# Patient Record
Sex: Female | Born: 1970 | Race: Black or African American | Hispanic: No | Marital: Single | Smoking: Former smoker
Health system: Southern US, Community
[De-identification: ages and names within clinical notes are randomized; demographics above are authoritative.]

## PROBLEM LIST (undated history)

## (undated) DIAGNOSIS — I10 Essential (primary) hypertension: Secondary | ICD-10-CM

## (undated) HISTORY — PX: HERNIA REPAIR: SHX51

## (undated) HISTORY — PX: DILITATION & CURRETTAGE/HYSTROSCOPY WITH ESSURE: SHX5573

---

## 2007-06-21 ENCOUNTER — Emergency Department (HOSPITAL_COMMUNITY): Admission: EM | Admit: 2007-06-21 | Discharge: 2007-06-21 | Payer: Self-pay | Admitting: Emergency Medicine

## 2007-06-24 ENCOUNTER — Emergency Department (HOSPITAL_COMMUNITY): Admission: EM | Admit: 2007-06-24 | Discharge: 2007-06-24 | Payer: Self-pay | Admitting: Emergency Medicine

## 2007-10-17 ENCOUNTER — Ambulatory Visit: Payer: Self-pay | Admitting: Family Medicine

## 2008-03-22 ENCOUNTER — Emergency Department (HOSPITAL_COMMUNITY): Admission: EM | Admit: 2008-03-22 | Discharge: 2008-03-22 | Payer: Self-pay | Admitting: Emergency Medicine

## 2008-11-24 ENCOUNTER — Emergency Department (HOSPITAL_COMMUNITY): Admission: EM | Admit: 2008-11-24 | Discharge: 2008-11-24 | Payer: Self-pay | Admitting: Emergency Medicine

## 2009-01-29 ENCOUNTER — Ambulatory Visit: Payer: Self-pay | Admitting: Family Medicine

## 2009-05-14 ENCOUNTER — Emergency Department (HOSPITAL_COMMUNITY): Admission: EM | Admit: 2009-05-14 | Discharge: 2009-05-14 | Payer: Self-pay | Admitting: Family Medicine

## 2009-06-29 ENCOUNTER — Emergency Department (HOSPITAL_COMMUNITY): Admission: EM | Admit: 2009-06-29 | Discharge: 2009-06-29 | Payer: Self-pay | Admitting: Family Medicine

## 2009-09-07 ENCOUNTER — Ambulatory Visit: Payer: Self-pay | Admitting: Family Medicine

## 2009-11-11 ENCOUNTER — Emergency Department (HOSPITAL_COMMUNITY): Admission: EM | Admit: 2009-11-11 | Discharge: 2009-11-11 | Payer: Self-pay | Admitting: Emergency Medicine

## 2010-07-24 ENCOUNTER — Emergency Department (HOSPITAL_COMMUNITY): Admission: EM | Admit: 2010-07-24 | Discharge: 2010-07-24 | Payer: Self-pay | Admitting: Family Medicine

## 2010-10-17 ENCOUNTER — Emergency Department (HOSPITAL_COMMUNITY): Admission: EM | Admit: 2010-10-17 | Discharge: 2010-10-17 | Payer: Self-pay | Admitting: Emergency Medicine

## 2011-03-11 LAB — GC/CHLAMYDIA PROBE AMP, GENITAL
Chlamydia, DNA Probe: NEGATIVE
GC Probe Amp, Genital: NEGATIVE

## 2011-03-11 LAB — RPR: RPR Ser Ql: NONREACTIVE

## 2011-03-11 LAB — HIV ANTIBODY (ROUTINE TESTING W REFLEX): HIV: NONREACTIVE

## 2011-03-11 LAB — WET PREP, GENITAL

## 2011-03-29 LAB — POCT URINALYSIS DIP (DEVICE)
Glucose, UA: NEGATIVE mg/dL
Nitrite: NEGATIVE
Protein, ur: NEGATIVE mg/dL
Specific Gravity, Urine: >=1.03 (ref 1.005–1.030)
Urobilinogen, UA: 0.2 mg/dL (ref 0.0–1.0)

## 2011-03-29 LAB — WET PREP, GENITAL: Yeast Wet Prep HPF POC: NONE SEEN

## 2011-03-29 LAB — HIV ANTIBODY (ROUTINE TESTING W REFLEX): HIV: NONREACTIVE

## 2011-03-29 LAB — POCT RAPID STREP A (OFFICE): Streptococcus, Group A Screen (Direct): NEGATIVE

## 2011-03-29 LAB — RPR: RPR Ser Ql: NONREACTIVE

## 2011-03-29 LAB — POCT PREGNANCY, URINE: Preg Test, Ur: NEGATIVE

## 2011-04-02 LAB — POCT URINALYSIS DIP (DEVICE)
Glucose, UA: NEGATIVE mg/dL
Nitrite: NEGATIVE
Urobilinogen, UA: 0.2 mg/dL (ref 0.0–1.0)

## 2011-04-02 LAB — GC/CHLAMYDIA PROBE AMP, GENITAL: Chlamydia, DNA Probe: NEGATIVE

## 2011-04-02 LAB — HIV ANTIBODY (ROUTINE TESTING W REFLEX): HIV: NONREACTIVE

## 2011-04-02 LAB — WET PREP, GENITAL

## 2011-06-26 ENCOUNTER — Inpatient Hospital Stay (INDEPENDENT_AMBULATORY_CARE_PROVIDER_SITE_OTHER)
Admission: RE | Admit: 2011-06-26 | Discharge: 2011-06-26 | Disposition: A | Discharge status: Home / Self Care | Payer: Medicaid Other | Source: Ambulatory Visit | Attending: Family Medicine | Admitting: Family Medicine

## 2011-06-26 DIAGNOSIS — L988 Other specified disorders of the skin and subcutaneous tissue: Secondary | ICD-10-CM

## 2011-07-21 ENCOUNTER — Emergency Department (HOSPITAL_COMMUNITY)
Admission: EM | Admit: 2011-07-21 | Discharge: 2011-07-21 | Disposition: A | Discharge status: Home / Self Care | Payer: Medicaid Other | Attending: Emergency Medicine | Admitting: Emergency Medicine

## 2011-07-21 DIAGNOSIS — R220 Localized swelling, mass and lump, head: Secondary | ICD-10-CM | POA: Insufficient documentation

## 2011-07-21 DIAGNOSIS — I10 Essential (primary) hypertension: Secondary | ICD-10-CM | POA: Insufficient documentation

## 2011-07-21 DIAGNOSIS — T7840XA Allergy, unspecified, initial encounter: Secondary | ICD-10-CM | POA: Insufficient documentation

## 2011-07-21 DIAGNOSIS — L509 Urticaria, unspecified: Secondary | ICD-10-CM | POA: Insufficient documentation

## 2011-07-21 DIAGNOSIS — H5789 Other specified disorders of eye and adnexa: Secondary | ICD-10-CM | POA: Insufficient documentation

## 2011-07-21 DIAGNOSIS — F341 Dysthymic disorder: Secondary | ICD-10-CM | POA: Insufficient documentation

## 2011-07-21 DIAGNOSIS — Z79899 Other long term (current) drug therapy: Secondary | ICD-10-CM | POA: Insufficient documentation

## 2011-10-11 LAB — I-STAT 8, (EC8 V) (CONVERTED LAB)
BUN: 11
Bicarbonate: 23.1
Glucose, Bld: 97
pCO2, Ven: 41.2 — ABNORMAL LOW
pH, Ven: 7.357 — ABNORMAL HIGH

## 2011-10-11 LAB — URINALYSIS, ROUTINE W REFLEX MICROSCOPIC
Bilirubin Urine: NEGATIVE
Hgb urine dipstick: NEGATIVE
Ketones, ur: NEGATIVE
Specific Gravity, Urine: >1.03 — ABNORMAL HIGH
pH: 6

## 2011-10-11 LAB — DIFFERENTIAL
Basophils Absolute: 0
Lymphocytes Relative: 25
Lymphs Abs: 1.9
Monocytes Absolute: 0.5
Monocytes Relative: 7
Neutro Abs: 5.2

## 2011-10-11 LAB — ETHANOL: Alcohol, Ethyl (B): <5

## 2011-10-11 LAB — RAPID URINE DRUG SCREEN, HOSP PERFORMED
Amphetamines: NOT DETECTED
Barbiturates: NOT DETECTED
Cocaine: NOT DETECTED
Opiates: NOT DETECTED

## 2011-10-11 LAB — CBC
Hemoglobin: 13.4
RBC: 4.38

## 2011-10-11 LAB — POCT PREGNANCY, URINE: Preg Test, Ur: NEGATIVE

## 2011-10-11 LAB — POCT I-STAT CREATININE
Creatinine, Ser: 0.8
Operator id: 288331

## 2012-02-19 ENCOUNTER — Encounter (HOSPITAL_COMMUNITY): Payer: Self-pay | Admitting: *Deleted

## 2012-02-19 ENCOUNTER — Emergency Department (HOSPITAL_COMMUNITY)
Admission: EM | Admit: 2012-02-19 | Discharge: 2012-02-20 | Discharge status: Against Medical Advice | Payer: Medicaid Other | Attending: Emergency Medicine | Admitting: Emergency Medicine

## 2012-02-19 ENCOUNTER — Other Ambulatory Visit: Payer: Self-pay

## 2012-02-19 DIAGNOSIS — R079 Chest pain, unspecified: Secondary | ICD-10-CM | POA: Insufficient documentation

## 2012-02-19 HISTORY — DX: Essential (primary) hypertension: I10

## 2012-02-19 LAB — COMPREHENSIVE METABOLIC PANEL
ALT: 10 U/L (ref 0–35)
AST: 16 U/L (ref 0–37)
Albumin: 3 g/dL — ABNORMAL LOW (ref 3.5–5.2)
CO2: 23 mEq/L (ref 19–32)
Calcium: 8.8 mg/dL (ref 8.4–10.5)
Creatinine, Ser: 0.76 mg/dL (ref 0.50–1.10)
GFR calc non Af Amer: >90 mL/min (ref >90)
Sodium: 138 mEq/L (ref 135–145)

## 2012-02-19 LAB — DIFFERENTIAL
Basophils Absolute: 0 10*3/uL (ref 0.0–0.1)
Lymphocytes Relative: 33 % (ref 12–46)
Monocytes Absolute: 0.6 10*3/uL (ref 0.1–1.0)
Monocytes Relative: 6 % (ref 3–12)
Neutro Abs: 5.6 10*3/uL (ref 1.7–7.7)
Neutrophils Relative %: 57 % (ref 43–77)

## 2012-02-19 LAB — CBC
HCT: 38.6 % (ref 36.0–46.0)
Hemoglobin: 13.1 g/dL (ref 12.0–15.0)
WBC: 9.8 10*3/uL (ref 4.0–10.5)

## 2012-02-19 LAB — CK TOTAL AND CKMB (NOT AT ARMC)
CK, MB: 1.7 ng/mL (ref 0.3–4.0)
Relative Index: 1.4 (ref 0.0–2.5)

## 2012-02-19 LAB — POCT I-STAT TROPONIN I

## 2012-02-19 NOTE — ED Notes (Signed)
Called for exam room, no answer, unable to find, not in w/r triage or h/w.

## 2012-02-19 NOTE — ED Notes (Signed)
Pt c/o lt sided chest pain for 3-4 days with sob dizziness and nausea.  Her pain is worse lying down.

## 2012-02-20 NOTE — ED Notes (Signed)
Pt called, unable to locate.

## 2012-08-29 ENCOUNTER — Emergency Department (HOSPITAL_COMMUNITY)
Admission: EM | Admit: 2012-08-29 | Discharge: 2012-08-29 | Disposition: A | Discharge status: Home / Self Care | Payer: Medicaid Other | Source: Home / Self Care | Attending: Emergency Medicine | Admitting: Emergency Medicine

## 2012-08-29 ENCOUNTER — Encounter (HOSPITAL_COMMUNITY): Payer: Self-pay | Admitting: *Deleted

## 2012-08-29 DIAGNOSIS — G8929 Other chronic pain: Secondary | ICD-10-CM

## 2012-08-29 DIAGNOSIS — Z202 Contact with and (suspected) exposure to infections with a predominantly sexual mode of transmission: Secondary | ICD-10-CM

## 2012-08-29 DIAGNOSIS — K089 Disorder of teeth and supporting structures, unspecified: Secondary | ICD-10-CM

## 2012-08-29 DIAGNOSIS — Z2089 Contact with and (suspected) exposure to other communicable diseases: Secondary | ICD-10-CM

## 2012-08-29 LAB — WET PREP, GENITAL
Trich, Wet Prep: NONE SEEN
Yeast Wet Prep HPF POC: NONE SEEN

## 2012-08-29 MED ORDER — ACETAMINOPHEN-CODEINE #3 300-30 MG PO TABS: 1.0000 | ORAL_TABLET | Freq: Four times a day (QID) | ORAL | Status: AC | PRN

## 2012-08-29 MED ORDER — LIDOCAINE HCL (PF) 1 % IJ SOLN
INTRAMUSCULAR | Status: AC
  Filled 2012-08-29: qty 5

## 2012-08-29 MED ORDER — AZITHROMYCIN 250 MG PO TABS
ORAL_TABLET | ORAL | Status: AC
  Filled 2012-08-29: qty 4

## 2012-08-29 MED ORDER — CEFTRIAXONE SODIUM 250 MG IJ SOLR
250.0000 mg | Freq: Once | INTRAMUSCULAR | Status: AC
  Administered 2012-08-29: 250 mg via INTRAMUSCULAR

## 2012-08-29 MED ORDER — CEFTRIAXONE SODIUM 250 MG IJ SOLR
INTRAMUSCULAR | Status: AC
  Filled 2012-08-29: qty 250

## 2012-08-29 MED ORDER — METRONIDAZOLE 500 MG PO TABS: 500.0000 mg | ORAL_TABLET | Freq: Two times a day (BID) | ORAL | Status: AC

## 2012-08-29 MED ORDER — AZITHROMYCIN 250 MG PO TABS
1000.0000 mg | ORAL_TABLET | Freq: Once | ORAL | Status: AC
  Administered 2012-08-29: 1000 mg via ORAL

## 2012-08-29 NOTE — ED Notes (Signed)
Pt reports dental pain on right top of gums and possible exposure to std from partner (discharge with smell per pt) for the past week

## 2012-08-29 NOTE — ED Provider Notes (Signed)
History     CSN: [id number]  Arrival date & time 08/29/12  0950   First MD Initiated Contact with Patient 08/29/12 [phone]      Chief Complaint  Patient presents with  . Dental Pain    (Consider location/radiation/quality/duration/timing/severity/associated sxs/prior treatment) HPI Comments: Patient presents urgent care complaining of right upper dental pain, denies any facial swelling, fevers or dental or oral injury. She has had problems with the same tooth has been proposed treatment by an oral surgeon but he would cause her $900 that she is unable to cover this cause at this point. She has on and off throbbing pain from the same tooth.  Patient also complains of a vaginal discharge that somewhat smells but she's concerned as she has had a post potential exposure to an STD from a partner, on further questioning patient is a unable to recall exactly what she was told and is not certain if the patient was seen at any medical facility.  Patient is a 41 y.o. female presenting with tooth pain and vaginal discharge. The history is provided by the patient.  Dental PainThe primary symptoms include mouth pain and dental injury. Primary symptoms do not include oral bleeding, oral lesions, fever, shortness of breath, sore throat, angioedema or cough. The symptoms began more than 1 week ago. The symptoms are improving. The symptoms are recurrent. The symptoms occur constantly.  Additional symptoms do not include: dental sensitivity to temperature, gum swelling, trouble swallowing and ear pain.   Vaginal Discharge This is a new problem. Pertinent negatives include no shortness of breath. She has tried nothing for the symptoms.    Past Medical History  Diagnosis Date  . Hypertension     History reviewed. No pertinent past surgical history.  Family History  Problem Relation Age of Onset  . Diabetes Mother   . Cancer Mother     History  Substance Use Topics  . Smoking status: Current  Everyday Smoker  . Smokeless tobacco: Not on file  . Alcohol Use: Yes    OB History    Grav Para Term Preterm Abortions TAB SAB Ect Mult Living                  Review of Systems  Constitutional: Negative for fever, activity change and appetite change.  HENT: Negative for ear pain, sore throat, mouth sores, trouble swallowing, neck pain and voice change.   Eyes: Negative for discharge.  Respiratory: Negative for cough and shortness of breath.   Genitourinary: Positive for vaginal discharge. Negative for enuresis and dyspareunia.  Skin: Negative for rash.  Neurological: Negative for dizziness.    Allergies  Review of patient's allergies indicates no known allergies.  Home Medications   Current Outpatient Rx  Name Route Sig Dispense Refill  . NORETHIN-ETH ESTRAD TRIPHASIC 0.5/0.75/1-35 MG-MCG PO TABS Oral Take 1 tablet by mouth daily.    Marland Kitchen PROPRANOLOL HCL 80 MG PO TABS Oral Take 80 mg by mouth daily.    . ACETAMINOPHEN-CODEINE #3 300-30 MG PO TABS Oral Take 1-2 tablets by mouth every 6 (six) hours as needed for pain. 15 tablet 0  . METRONIDAZOLE 500 MG PO TABS Oral Take 1 tablet (500 mg total) by mouth 2 (two) times daily. 14 tablet 0    BP 123/84  Pulse 80  Temp 98.8 F (37.1 C) (Oral)  Resp 16  SpO2 100%  LMP 08/22/2012  Physical Exam  Nursing note and vitals reviewed. Constitutional: Vital signs are normal. She appears  well-developed and well-nourished.  Non-toxic appearance. She does not have a sickly appearance.  HENT:  Head: Normocephalic.  Mouth/Throat: Uvula is midline, oropharynx is clear and moist and mucous membranes are normal.    Eyes: Conjunctivae are normal.  Neck: Neck supple.  Cardiovascular: Normal rate.   Pulmonary/Chest: Effort normal.  Abdominal: She exhibits no distension.  Skin: No erythema.    ED Course  Procedures (including critical care time)  Labs Reviewed  WET PREP, GENITAL - Abnormal; Notable for the following:    Clue Cells  Wet Prep HPF POC MANY (*)     WBC, Wet Prep HPF POC FEW (*)     All other components within normal limits  GC/CHLAMYDIA PROBE AMP, GENITAL   No results found.   1. Chronic dental pain   2. Sexually transmitted disease exposure       MDM  Patient with chronic dental pain and also concern of having been exposed to an STD        Jimmie Molly, MD 08/29/12 1701

## 2012-08-30 LAB — GC/CHLAMYDIA PROBE AMP, GENITAL
Chlamydia, DNA Probe: NEGATIVE
GC Probe Amp, Genital: NEGATIVE

## 2012-08-30 NOTE — ED Notes (Signed)
GC/Chlamydia neg., Wet prep: many clue cells, few WBC's. Pt. adequately treated with Flagyl. Vassie Moselle 08/30/2012

## 2012-12-13 ENCOUNTER — Other Ambulatory Visit: Payer: Self-pay | Admitting: Internal Medicine

## 2012-12-13 DIAGNOSIS — Z1231 Encounter for screening mammogram for malignant neoplasm of breast: Secondary | ICD-10-CM

## 2013-01-24 ENCOUNTER — Ambulatory Visit: Payer: Medicaid Other

## 2013-02-21 ENCOUNTER — Ambulatory Visit
Admission: RE | Admit: 2013-02-21 | Discharge: 2013-02-21 | Disposition: A | Discharge status: Home / Self Care | Payer: Medicaid Other | Source: Ambulatory Visit | Attending: Internal Medicine | Admitting: Internal Medicine

## 2013-02-21 DIAGNOSIS — Z1231 Encounter for screening mammogram for malignant neoplasm of breast: Secondary | ICD-10-CM

## 2013-05-16 ENCOUNTER — Emergency Department (HOSPITAL_COMMUNITY)
Admission: EM | Admit: 2013-05-16 | Discharge: 2013-05-16 | Disposition: A | Discharge status: Home / Self Care | Payer: Medicaid Other | Source: Home / Self Care

## 2013-05-16 ENCOUNTER — Other Ambulatory Visit (HOSPITAL_COMMUNITY)
Admission: RE | Admit: 2013-05-16 | Discharge: 2013-05-16 | Disposition: A | Discharge status: Home / Self Care | Payer: Medicaid Other | Source: Ambulatory Visit | Attending: Family Medicine | Admitting: Family Medicine

## 2013-05-16 ENCOUNTER — Encounter (HOSPITAL_COMMUNITY): Payer: Self-pay | Admitting: Emergency Medicine

## 2013-05-16 DIAGNOSIS — B373 Candidiasis of vulva and vagina: Secondary | ICD-10-CM

## 2013-05-16 DIAGNOSIS — J069 Acute upper respiratory infection, unspecified: Secondary | ICD-10-CM

## 2013-05-16 DIAGNOSIS — Z113 Encounter for screening for infections with a predominantly sexual mode of transmission: Secondary | ICD-10-CM | POA: Insufficient documentation

## 2013-05-16 DIAGNOSIS — N760 Acute vaginitis: Secondary | ICD-10-CM | POA: Insufficient documentation

## 2013-05-16 LAB — POCT URINALYSIS DIP (DEVICE)
Bilirubin Urine: NEGATIVE
Leukocytes, UA: NEGATIVE
Nitrite: NEGATIVE
Protein, ur: NEGATIVE mg/dL
Urobilinogen, UA: 0.2 mg/dL (ref 0.0–1.0)
pH: 6.5 (ref 5.0–8.0)

## 2013-05-16 LAB — RPR: RPR Ser Ql: NONREACTIVE

## 2013-05-16 MED ORDER — FLUCONAZOLE 150 MG PO TABS: 150.0000 mg | ORAL_TABLET | Freq: Once | ORAL | Status: AC

## 2013-05-16 NOTE — ED Provider Notes (Signed)
History     CSN: [id number]  Arrival date & time 05/16/13  1048   First MD Initiated Contact with Patient 05/16/13 1141      Chief Complaint  Patient presents with  . Sore Throat    (Consider location/radiation/quality/duration/timing/severity/associated sxs/prior treatment) HPI Comments: C/o rash on both lower arms and a sore throat that started a little over aa week ago.  ST resolved.  Rash is still  There despite otc hydrocortisone.  It did itch a little.  Has used some calamine lotion both helped but temporrary.  No other constitutional sx.  But has had some vaginal itching and discharge for the last 1-2 wks and worried about STD.  States she had a recent encounter with an old partner who had STD in the past.  She thinks he had some chlamydia but is unsure if he had anything else.  No condom use.  Denies pelvic or abd pain.  No n,v or gi sx.  Patient is a 42 y.o. female presenting with pharyngitis and rash. The history is provided by the patient.  Sore Throat This is a new problem. The current episode started more than 1 week ago. The problem has been resolved. Pertinent negatives include no chest pain, no abdominal pain, no headaches and no shortness of breath. Nothing aggravates the symptoms. Nothing relieves the symptoms.  Rash Associated symptoms: no chest pain and no shortness of breath     Past Medical History  Diagnosis Date  . Hypertension     Past Surgical History  Procedure Laterality Date  . Hernia repair      Family History  Problem Relation Age of Onset  . Diabetes Mother   . Cancer Mother     History  Substance Use Topics  . Smoking status: Former Games developer  . Smokeless tobacco: Not on file  . Alcohol Use: Yes    OB History   Grav Para Term Preterm Abortions TAB SAB Ect Mult Living                  Review of Systems  Respiratory: Negative for shortness of breath.   Cardiovascular: Negative for chest pain.  Gastrointestinal: Negative for  abdominal pain.  Skin: Positive for rash.  Neurological: Negative for headaches.    Allergies  Review of patient's allergies indicates no known allergies.  Home Medications   Current Outpatient Rx  Name  Route  Sig  Dispense  Refill  . OVER THE COUNTER MEDICATION      Cold and flu po meds for sore throat, hydrocortisone for rash         . fluconazole (DIFLUCAN) 150 MG tablet   Oral   Take 1 tablet (150 mg total) by mouth once.   1 tablet   0   . norethindrone-ethinyl estradiol (ORTHO-NOVUM 7/7/7, 28,) 0.5/0.75/1-35 MG-MCG tablet   Oral   Take 1 tablet by mouth daily.         . propranolol (INDERAL) 80 MG tablet   Oral   Take 80 mg by mouth daily.           BP 117/80  Pulse 74  Temp(Src) 98.3 F (36.8 C) (Oral)  Resp 16  SpO2 100%  LMP 05/02/2013  Physical Exam  Nursing note and vitals reviewed. Constitutional: She is oriented to person, place, and time. She appears well-developed and well-nourished. No distress.  HENT:  Head: Normocephalic and atraumatic.  Eyes: Conjunctivae and EOM are normal. Pupils are equal, round, and reactive to  light.  Neck: Normal range of motion. Neck supple.  Cardiovascular: Normal rate and regular rhythm.  Exam reveals friction rub. Exam reveals no gallop.   No murmur heard. Pulmonary/Chest: Effort normal and breath sounds normal. No respiratory distress. She has no wheezes. She has no rales. She exhibits no tenderness.  Abdominal: Soft. Bowel sounds are normal. She exhibits no distension. There is no tenderness.  Genitourinary: Vagina normal and uterus normal. No vaginal discharge found.  Musculoskeletal: Normal range of motion.  Neurological: She is alert and oriented to person, place, and time. She has normal reflexes. No cranial nerve deficit.  Skin: Skin is warm and dry. No rash noted. She is not diaphoretic.  Psychiatric: She has a normal mood and affect. Her behavior is normal.    ED Course  Procedures (including  critical care time)  Labs Reviewed  POCT URINALYSIS DIP (DEVICE) - Abnormal; Notable for the following:    Hgb urine dipstick SMALL (*)    All other components within normal limits  RPR  HIV ANTIBODY (ROUTINE TESTING)  POCT PREGNANCY, URINE  CERVICOVAGINAL ANCILLARY ONLY   No results found.   1. Candida vaginitis   2. URI (upper respiratory infection)       MDM  Given diflucan for her CV, discussed labs obtained and pending.  Will contact pt with results per protocol.  Her URI was likely a viral uri with accompanying exanthum.  Sx resolved currently.  SE and precautions of meds given.        Lewis Moccasin 05/16/13 1302

## 2013-05-16 NOTE — ED Notes (Signed)
Reports sore throat, and rash on arms for approximately one week.  Denies having run a fever.  No one else sick at home.  Also concerns for vaginal itching, denies an abnormal discharge.  Patient concerned for std

## 2013-05-16 NOTE — ED Notes (Signed)
Sent to bathroom for urine specimen 

## 2013-05-17 LAB — HIV ANTIBODY (ROUTINE TESTING W REFLEX): HIV: NONREACTIVE

## 2013-05-22 ENCOUNTER — Encounter (HOSPITAL_COMMUNITY): Payer: Self-pay

## 2013-05-22 ENCOUNTER — Emergency Department (HOSPITAL_COMMUNITY)
Admission: EM | Admit: 2013-05-22 | Discharge: 2013-05-22 | Discharge status: Against Medical Advice | Payer: Medicaid Other | Source: Home / Self Care

## 2013-05-22 ENCOUNTER — Emergency Department (HOSPITAL_COMMUNITY)
Admission: EM | Admit: 2013-05-22 | Discharge: 2013-05-22 | Disposition: A | Discharge status: Home / Self Care | Payer: Medicaid Other | Attending: Emergency Medicine | Admitting: Emergency Medicine

## 2013-05-22 DIAGNOSIS — R220 Localized swelling, mass and lump, head: Secondary | ICD-10-CM | POA: Insufficient documentation

## 2013-05-22 DIAGNOSIS — Z5320 Procedure and treatment not carried out because of patient's decision for unspecified reasons: Secondary | ICD-10-CM | POA: Insufficient documentation

## 2013-05-22 DIAGNOSIS — I10 Essential (primary) hypertension: Secondary | ICD-10-CM | POA: Insufficient documentation

## 2013-05-22 DIAGNOSIS — Z87891 Personal history of nicotine dependence: Secondary | ICD-10-CM | POA: Insufficient documentation

## 2013-05-22 DIAGNOSIS — L299 Pruritus, unspecified: Secondary | ICD-10-CM | POA: Insufficient documentation

## 2013-05-22 DIAGNOSIS — R221 Localized swelling, mass and lump, neck: Secondary | ICD-10-CM | POA: Insufficient documentation

## 2013-05-22 DIAGNOSIS — Z79899 Other long term (current) drug therapy: Secondary | ICD-10-CM | POA: Insufficient documentation

## 2013-05-22 LAB — HIV 1/2 CONFIRMATION: HIV-2 Ab: NEGATIVE

## 2013-05-22 NOTE — ED Provider Notes (Signed)
History  This chart was scribed for non-physician practitioner, Hector Shade, working with Dione Booze, MD by Ardeen Jourdain, ED Scribe. This patient was seen in room TR06C/TR06C and the patient's care was started at 1837.  CSN: [id number]  Arrival date & time 05/22/13  1643   First MD Initiated Contact with Patient 05/22/13 1837      Chief Complaint  Patient presents with  . Neck Pain     The history is provided by the patient. No language interpreter was used.    HPI Comments: Emma Vaughn is a 42 y.o. female who presents to the Emergency Department complaining of gradual onset, gradually worsening, intermittent neck pain that began 4 days ago with associated mild itching. She states she had a hair weave which she believes caused the pain. She states she found a knot on the back left side of her neck. She reports removing the weave when she noticed the knot. She denies taking anything for the pain. She denies any CP, SOB, trouble swallowing, HA, fever, chills, nausea, emesis and diarrhea as associated symptoms.    Past Medical History  Diagnosis Date  . Hypertension     Past Surgical History  Procedure Laterality Date  . Hernia repair    . Dilitation & currettage/hystroscopy with essure      Family History  Problem Relation Age of Onset  . Diabetes Mother   . Cancer Mother     History  Substance Use Topics  . Smoking status: Former Games developer  . Smokeless tobacco: Not on file  . Alcohol Use: Yes   No OB history available.  Review of Systems  HENT: Positive for neck pain.   All other systems reviewed and are negative.    Allergies  Review of patient's allergies indicates no known allergies.  Home Medications   Current Outpatient Rx  Name  Route  Sig  Dispense  Refill  . Aspirin-Salicylamide-Caffeine (BC HEADACHE POWDER PO)   Oral   Take 1 packet by mouth daily as needed (for pain).         . norethindrone-ethinyl estradiol (PIRMELLA 7/7/7)  0.5/0.75/1-35 MG-MCG tablet   Oral   Take 1 tablet by mouth daily.         . propranolol (INDERAL) 80 MG tablet   Oral   Take 80 mg by mouth daily.         Marland Kitchen tretinoin (RETIN-A) 0.05 % cream   Topical   Apply 1 application topically 2 (two) times daily.         . fluconazole (DIFLUCAN) 150 MG tablet   Oral   Take 1 tablet (150 mg total) by mouth once.   1 tablet   0     Triage Vitals: BP 141/88  Pulse 81  Temp(Src) 98.2 F (36.8 C) (Oral)  Resp 18  SpO2 99%  LMP 05/02/2013  Physical Exam  Nursing note and vitals reviewed. Constitutional: She is oriented to person, place, and time. She appears well-developed and well-nourished. No distress.  HENT:  Head: Normocephalic and atraumatic.  Right Ear: External ear normal.  Left Ear: External ear normal.  Mouth/Throat: Oropharynx is clear and moist. No oropharyngeal exudate.  TMs normal bilaterally   Eyes: EOM are normal. Pupils are equal, round, and reactive to light.  Neck: Normal range of motion. Neck supple. No tracheal deviation present.  Cardiovascular: Normal rate, regular rhythm and normal heart sounds.  Exam reveals no gallop and no friction rub.   No murmur heard. Pulmonary/Chest:  Effort normal and breath sounds normal. No respiratory distress. She has no wheezes. She has no rales. She exhibits no tenderness.  Abdominal: Soft. Bowel sounds are normal. She exhibits no distension. There is no tenderness.  Musculoskeletal: Normal range of motion. She exhibits tenderness ( Mild left nape of neck). She exhibits no edema.  3 cm soft mildly tender mass to left lower occipital region. No induration, erythema or warmth. Skin intact   Neurological: She is alert and oriented to person, place, and time.  Skin: Skin is warm and dry. She is not diaphoretic.  Psychiatric: She has a normal mood and affect. Her behavior is normal.    ED Course  Procedures (including critical care time)  DIAGNOSTIC STUDIES: Oxygen  Saturation is 99% on room air, normal by my interpretation.    COORDINATION OF CARE:  6:48 PM-Discussed treatment plan which includes instructions for home care with pt at bedside and pt agreed to plan.    Labs Reviewed - No data to display No results found.   1. Scalp mass       MDM  Believe "knot" on back of head is benign,  Likely due to irritation and trauma of hair weave being too tight.  No signs of infection.  Advised pt to alternate ice and heat for swelling.  Do not use hair weave until swelling goes down.  F/u with PCP or urgent care if swelling does not decrease in 3-4 days.  Return to ED if more worrisome symptoms arise. Vitals: unremarkable. Discharged in stable condition.     I personally performed the services described in this documentation, which was scribed in my presence. The recorded information has been reviewed and is accurate.       Junius Finner, PA-C 05/23/13 2105

## 2013-05-22 NOTE — ED Notes (Signed)
Patient came to ED earlier today and did not want to wait to she went to Urgent Care. She did not want to wait up there too so she came down to the ED.

## 2013-05-22 NOTE — ED Notes (Signed)
Patient presents to ED c/o a knot of the left side of her neck that she noticed 4 days ago. Denies any headache. Slight swelling noted to left side of neck. Tender to touch.

## 2013-05-22 NOTE — ED Notes (Signed)
Pt c/o sore, swollen area in hairline, posterior scalp. States had hair done up in a weave. Thought that was the cause of her pain.

## 2013-05-22 NOTE — ED Notes (Signed)
Pt never triaged she left and went to ucc

## 2013-05-24 NOTE — ED Provider Notes (Signed)
Medical screening examination/treatment/procedure(s) were performed by non-physician practitioner and as supervising physician I was immediately available for consultation/collaboration.  Dione Booze, MD 05/24/13 [phone]

## 2013-05-28 ENCOUNTER — Emergency Department (HOSPITAL_COMMUNITY)
Admission: EM | Admit: 2013-05-28 | Discharge: 2013-05-29 | Disposition: A | Discharge status: Home / Self Care | Payer: Medicaid Other | Attending: Emergency Medicine | Admitting: Emergency Medicine

## 2013-05-28 ENCOUNTER — Emergency Department (HOSPITAL_COMMUNITY): Payer: Medicaid Other

## 2013-05-28 ENCOUNTER — Encounter (HOSPITAL_COMMUNITY): Payer: Self-pay | Admitting: Emergency Medicine

## 2013-05-28 DIAGNOSIS — L02811 Cutaneous abscess of head [any part, except face]: Secondary | ICD-10-CM

## 2013-05-28 DIAGNOSIS — R0989 Other specified symptoms and signs involving the circulatory and respiratory systems: Secondary | ICD-10-CM | POA: Insufficient documentation

## 2013-05-28 DIAGNOSIS — L02818 Cutaneous abscess of other sites: Secondary | ICD-10-CM | POA: Insufficient documentation

## 2013-05-28 DIAGNOSIS — Z79899 Other long term (current) drug therapy: Secondary | ICD-10-CM | POA: Insufficient documentation

## 2013-05-28 DIAGNOSIS — I10 Essential (primary) hypertension: Secondary | ICD-10-CM | POA: Insufficient documentation

## 2013-05-28 DIAGNOSIS — IMO0001 Reserved for inherently not codable concepts without codable children: Secondary | ICD-10-CM | POA: Insufficient documentation

## 2013-05-28 DIAGNOSIS — R509 Fever, unspecified: Secondary | ICD-10-CM | POA: Insufficient documentation

## 2013-05-28 DIAGNOSIS — Z87891 Personal history of nicotine dependence: Secondary | ICD-10-CM | POA: Insufficient documentation

## 2013-05-28 DIAGNOSIS — L03818 Cellulitis of other sites: Secondary | ICD-10-CM | POA: Insufficient documentation

## 2013-05-28 LAB — URINALYSIS, ROUTINE W REFLEX MICROSCOPIC
Ketones, ur: NEGATIVE mg/dL
Nitrite: NEGATIVE
Protein, ur: NEGATIVE mg/dL
Urobilinogen, UA: 0.2 mg/dL (ref 0.0–1.0)
pH: 5.5 (ref 5.0–8.0)

## 2013-05-28 LAB — URINE MICROSCOPIC-ADD ON

## 2013-05-28 MED ORDER — ACETAMINOPHEN 325 MG PO TABS
650.0000 mg | ORAL_TABLET | Freq: Once | ORAL | Status: AC
  Administered 2013-05-28: 650 mg via ORAL
  Filled 2013-05-28: qty 2

## 2013-05-28 MED ORDER — CLINDAMYCIN HCL 300 MG PO CAPS: 300.0000 mg | ORAL_CAPSULE | Freq: Three times a day (TID) | ORAL | Status: AC

## 2013-05-28 NOTE — ED Notes (Signed)
Pt states two weeks ago she was seen at [facility] for a sore throat and rash  Pt was told it was a viral infection  Pt states about a week ago a knot came up on the back of her head  Pt states she was seen at [facility] and was told it was a pulled muscle and was told to take antiinflammatories and apply ice packs to it  Pt states it has gotten a little smaller but now it feels mushy like it may have pus in it or something  Pt states yesterday she woke up and ached all over and had pain in her joints and they feel like her joints are going to lock up on her  Pt states she had some left over antibiotics so she took one  Pt states after she took it the knot on her head started throbbing, she had burning pains in her arm and hand, and her heart started beating really hard  Pt states she took it about 3 pm today

## 2013-05-28 NOTE — ED Notes (Signed)
Pt is aware of the need for urine, however is unable to provide a sample at this time. 

## 2013-05-28 NOTE — ED Provider Notes (Signed)
History     CSN: [id number]  Arrival date & time 05/28/13  2012   First MD Initiated Contact with Patient 05/28/13 2042      No chief complaint on file.   (Consider location/radiation/quality/duration/timing/severity/associated sxs/prior treatment) HPI History provided by pt and prior chart.  Pt presents w/ multiple complaints.  She has a soft, painful mass on left occipital scalp.  Per prior chart, was evaluated for same in ED on 05/24/13 and it was thought to be benign and secondary to irritation from weave at that time.  Pt reports that she took NSAIDs, alternated heat/ice and removed weave as instructed, but the knot has persisted.  No drainage.  Associated w/ diffuse arthralgias, particularly hands, wrist, knees and ankles.  Denies fever, cough, abd pain, vomiting, diarrhea, urinary sx.  H/o HTN, otherwise healthy.  No FH autoimmune disease.    Past Medical History  Diagnosis Date  . Hypertension     Past Surgical History  Procedure Laterality Date  . Hernia repair    . Dilitation & currettage/hystroscopy with essure      Family History  Problem Relation Age of Onset  . Diabetes Mother   . Hypertension Mother     History  Substance Use Topics  . Smoking status: Former Games developer  . Smokeless tobacco: Not on file  . Alcohol Use: Yes    OB History   Grav Para Term Preterm Abortions TAB SAB Ect Mult Living                  Review of Systems  All other systems reviewed and are negative.    Allergies  Review of patient's allergies indicates no known allergies.  Home Medications   Current Outpatient Rx  Name  Route  Sig  Dispense  Refill  . Aspirin-Salicylamide-Caffeine (BC HEADACHE POWDER PO)   Oral   Take 1 packet by mouth daily as needed (for pain).         Marland Kitchen ibuprofen (ADVIL,MOTRIN) 200 MG tablet   Oral   Take 200 mg by mouth every 6 (six) hours as needed for pain.         Marland Kitchen norethindrone-ethinyl estradiol (PIRMELLA 7/7/7) 0.5/0.75/1-35 MG-MCG  tablet   Oral   Take 1 tablet by mouth daily.         . propranolol (INDERAL) 80 MG tablet   Oral   Take 80 mg by mouth daily.         Marland Kitchen tretinoin (RETIN-A) 0.05 % cream   Topical   Apply 1 application topically 2 (two) times daily.         . fluconazole (DIFLUCAN) 150 MG tablet   Oral   Take 1 tablet (150 mg total) by mouth once.   1 tablet   0     BP 133/77  Temp(Src) 100.9 F (38.3 C) (Oral)  Resp 18  Ht 5\' 3"  (1.6 m)  Wt 180 lb (81.647 kg)  BMI 31.89 kg/m2  SpO2 98%  LMP 05/02/2013  Physical Exam  Nursing note and vitals reviewed. Constitutional: She is oriented to person, place, and time. She appears well-developed and well-nourished. No distress.  HENT:  Head: Normocephalic and atraumatic.  3cm erythematous, soft, tender mass on L occipital scalp.  No drainage.  Visualized w/ bedside US and there does not appear to be a subcutaneous fluid collection.    Eyes:  Normal appearance  Neck: Normal range of motion. Neck supple.  No meningeal signs  Cardiovascular: Normal rate and  regular rhythm.   Pulmonary/Chest: Effort normal and breath sounds normal. No respiratory distress.  Diminished breath sounds at right lung base  Abdominal: Soft. Bowel sounds are normal. She exhibits no distension. There is no tenderness.  Musculoskeletal: Normal range of motion.  Lymphadenopathy:    She has no cervical adenopathy.  Neurological: She is alert and oriented to person, place, and time.  Skin: Skin is warm and dry. No rash noted.  Psychiatric: She has a normal mood and affect. Her behavior is normal.    ED Course  Procedures (including critical care time)   Date: 05/29/2013  Rate: 96  Rhythm: normal sinus rhythm  QRS Axis: normal  Intervals: normal  ST/T Wave abnormalities: normal  Conduction Disutrbances:none  Narrative Interpretation:   Old EKG Reviewed: unchanged   INCISION AND DRAINAGE Performed by: Ruby Cola E Consent: Verbal consent  obtained. Risks and benefits: risks, benefits and alternatives were discussed Type: abscess  Body area: L occipital scalp  Anesthesia: local infiltration  Incision was made with a scalpel.  Local anesthetic: lidocaine 2% w/out epinephrine  Anesthetic total: 4 ml  Complexity: complex Blunt dissection to break up loculations  Drainage: purulent  Drainage amount: large  Packing material: none  Patient tolerance: Patient tolerated the procedure well with no immediate complications.    Labs Reviewed  URINALYSIS, ROUTINE W REFLEX MICROSCOPIC   Dg Chest 2 View  05/28/2013   *RADIOLOGY REPORT*  Clinical Data: Fever, diminished breath sounds at the right lung base  CHEST - 2 VIEW  Comparison: None.  Findings: Cardiac leads overlie the chest.  Heart size is normal. Lung volumes are low but clear.  No pleural effusion.  No acute osseous finding.  IMPRESSION: Normal exam allowing for suboptimal aeration. If the patient's symptoms continue, consider PA and lateral chest radiographs obtained at full inspiration when the patient is clinically able.   Original Report Authenticated By: Christiana Pellant, M.D.     1. Abscess of scalp       MDM  42yo immunocompetent F presents w/ L occipital mass and arthralgias.  Was seen for mass 5 days ago and it was thought to be non-infectious at that time.  On exam, febrile, diminished breath sounds right lung base, abd benign, no meningeal signs, 3cm erythematous, tender mass of L occipital scalp w/ surrounding erythema but w/out obvious subq fluid collection on bedside US.  Will perform needle aspiration of mass, but suspect cellulitis at this time.  This may be etiology of fever and bodyaches but CXR and U/A ordered to r/o alternate source as well.  Pt has received tylenol.  10:13 PM    Large amt of pus removed with needle aspiration so I&D performed.  Will treat w/ abx d/t surrounding cellulitis.  CXR and U/A negative.  Results discussed w/ pt.   Advised f/u with her PCP and return for worsening sx.  11:58 PM        Otilio Miu, PA-C 05/28/13 2359  Otilio Miu, PA-C 05/29/13 [phone]

## 2013-05-31 NOTE — ED Provider Notes (Signed)
Medical screening examination/treatment/procedure(s) were performed by non-physician practitioner and as supervising physician I was immediately available for consultation/collaboration.   Shelda Jakes, MD 05/31/13 [phone]

## 2021-05-07 ENCOUNTER — Emergency Department (HOSPITAL_COMMUNITY): Payer: Self-pay

## 2021-05-07 ENCOUNTER — Encounter (HOSPITAL_COMMUNITY): Payer: Self-pay

## 2021-05-07 ENCOUNTER — Other Ambulatory Visit: Payer: Self-pay

## 2021-05-07 ENCOUNTER — Emergency Department (HOSPITAL_COMMUNITY)
Admission: EM | Admit: 2021-05-07 | Discharge: 2021-05-07 | Disposition: A | Discharge status: Home / Self Care | Payer: Self-pay | Attending: Emergency Medicine | Admitting: Emergency Medicine

## 2021-05-07 DIAGNOSIS — I10 Essential (primary) hypertension: Secondary | ICD-10-CM | POA: Insufficient documentation

## 2021-05-07 DIAGNOSIS — Z87891 Personal history of nicotine dependence: Secondary | ICD-10-CM | POA: Insufficient documentation

## 2021-05-07 DIAGNOSIS — J069 Acute upper respiratory infection, unspecified: Secondary | ICD-10-CM | POA: Insufficient documentation

## 2021-05-07 DIAGNOSIS — Z20822 Contact with and (suspected) exposure to covid-19: Secondary | ICD-10-CM | POA: Insufficient documentation

## 2021-05-07 DIAGNOSIS — Z79899 Other long term (current) drug therapy: Secondary | ICD-10-CM | POA: Insufficient documentation

## 2021-05-07 LAB — GROUP A STREP BY PCR: Group A Strep by PCR: NOT DETECTED

## 2021-05-07 MED ORDER — FLUTICASONE PROPIONATE 50 MCG/ACT NA SUSP: 2.0000 | Freq: Every day | NASAL | 0 refills | Status: AC

## 2021-05-07 MED ORDER — BENZONATATE 100 MG PO CAPS: 100.0000 mg | ORAL_CAPSULE | Freq: Three times a day (TID) | ORAL | 0 refills | Status: AC

## 2021-05-07 MED ORDER — CETIRIZINE HCL 10 MG PO TABS: 10.0000 mg | ORAL_TABLET | Freq: Every day | ORAL | 0 refills | Status: AC

## 2021-05-07 NOTE — ED Triage Notes (Signed)
Patient states she went to a 3 day festival at the beginning of May. Patient states after the festival she developed a cough and sore throat. Patient denies any fever.

## 2021-05-07 NOTE — ED Provider Notes (Signed)
[facility]-EMERGENCY DEPT Provider Note   CSN: [id number] Arrival date & time: 05/07/21  3212    History Chief Complaint  Patient presents with  . Sore Throat  . Cough    Emma Vaughn is a 50 y.o. female with past medical history significant hypertension who presents for evaluation of cough and sore throat.  Began 2 weeks ago when she was at an outside concert.  She is coughing up clear sputum.  She feels like she has a scratchy throat, clear rhinorrhea.  Some mild clear rhinorrhea.  Denies recent COVID exposures.  She is not vaccinated.  Denies fever, chills, nausea, vomiting, chest pain, shortness of breath abdominal pain, diarrhea, dysuria, paresthesias, weakness.  No hemoptysis.  No unilateral leg swelling, redness or warmth. No prior history of PE or DVT.  Denies additional aggravating or alleviating factors.  History obtained from patient and past medical records.  No interpreter used  HPI     Past Medical History:  Diagnosis Date  . Hypertension     There are no problems to display for this patient.   Past Surgical History:  Procedure Laterality Date  . DILITATION & CURRETTAGE/HYSTROSCOPY WITH ESSURE    . HERNIA REPAIR       OB History   No obstetric history on file.     Family History  Problem Relation Age of Onset  . Diabetes Mother   . Hypertension Mother     Social History   Tobacco Use  . Smoking status: Former Games developer  . Smokeless tobacco: Never Used  Vaping Use  . Vaping Use: Never used  Substance Use Topics  . Alcohol use: Yes  . Drug use: No    Home Medications Prior to Admission medications   Medication Sig Start Date End Date Taking? Authorizing Provider  benzonatate (TESSALON) 100 MG capsule Take 1 capsule (100 mg total) by mouth every 8 (eight) hours. 05/07/21  Yes Rosalea Withrow A, PA-C  cetirizine (ZYRTEC ALLERGY) 10 MG tablet Take 1 tablet (10 mg total) by mouth daily. 05/07/21  Yes Suhaan Perleberg A, PA-C   fluticasone (FLONASE) 50 MCG/ACT nasal spray Place 2 sprays into both nostrils daily. 05/07/21  Yes Willet Schleifer A, PA-C  Aspirin-Salicylamide-Caffeine (BC HEADACHE POWDER PO) Take 1 packet by mouth daily as needed (for pain).    [provider]  clindamycin (CLEOCIN) 300 MG capsule Take 1 capsule (300 mg total) by mouth 3 (three) times daily. 05/28/13   Schinlever, Santina Evans, PA-C  fluconazole (DIFLUCAN) 150 MG tablet Take 1 tablet (150 mg total) by mouth once. 05/16/13   Lewis Moccasin  ibuprofen (ADVIL,MOTRIN) 200 MG tablet Take 200 mg by mouth every 6 (six) hours as needed for pain.    [provider]  norethindrone-ethinyl estradiol (PIRMELLA 7/7/7) 0.5/0.75/1-35 MG-MCG tablet Take 1 tablet by mouth daily.    [provider]  propranolol (INDERAL) 80 MG tablet Take 80 mg by mouth daily.    [provider]  tretinoin (RETIN-A) 0.05 % cream Apply 1 application topically 2 (two) times daily.    [provider]    Allergies    Patient has no known allergies.  Review of Systems   Review of Systems  Constitutional: Positive for appetite change and fatigue.  HENT: Positive for postnasal drip, rhinorrhea and sore throat. Negative for congestion, ear discharge, ear pain, facial swelling, nosebleeds, sinus pressure, sinus pain, sneezing, trouble swallowing and voice change.   Respiratory: Positive for cough. Negative for apnea, choking,  chest tightness, shortness of breath, wheezing and stridor.   Cardiovascular: Negative.   Gastrointestinal: Negative.   Genitourinary: Negative.   Musculoskeletal: Negative.   Skin: Negative.   Neurological: Negative.   All other systems reviewed and are negative.   Physical Exam Updated Vital Signs BP (!) 144/102 (BP Location: Right Arm)   Pulse 96   Temp 98.4 F (36.9 C) (Oral)   Resp 16   Ht 5\' 3"  (1.6 m)   Wt 83 kg   LMP 05/07/2021   SpO2 99%   BMI 32.42 kg/m   Physical Exam Vitals and nursing  note reviewed.  Constitutional:      General: She is not in acute distress.    Appearance: She is well-developed. She is not ill-appearing, toxic-appearing or diaphoretic.  HENT:     Head: Normocephalic and atraumatic.     Jaw: There is normal jaw occlusion.     Right Ear: Tympanic membrane and ear canal normal.     Left Ear: Tympanic membrane and ear canal normal.     Nose: No congestion or rhinorrhea.     Mouth/Throat:     Lips: Pink.     Mouth: Mucous membranes are moist.     Pharynx: Oropharynx is clear. Uvula midline.     Tonsils: No tonsillar exudate or tonsillar abscesses.     Comments: Posterior oropharynx with mild erythema.  1+ tonsils bilaterally.  No exudate.  There is tonsillar erythema.  No evidence of PTA or RPA.  Uvula midline.  Tongue midline.  No pooling of secretions Eyes:     Pupils: Pupils are equal, round, and reactive to light.  Neck:     Trachea: Trachea and phonation normal.     Comments: No neck stiffness or neck rigidity Cardiovascular:     Rate and Rhythm: Normal rate.     Pulses: Normal pulses.     Heart sounds: Normal heart sounds.  Pulmonary:     Effort: Pulmonary effort is normal. No respiratory distress.     Breath sounds: Normal breath sounds and air entry.     Comments: Clear to auscultation bilaterally.  Speaks in full sentences without difficulty Chest:     Comments: Equal rise and fall to chest wall Abdominal:     General: Bowel sounds are normal. There is no distension.     Palpations: Abdomen is soft.     Tenderness: There is no abdominal tenderness. There is no right CVA tenderness, left CVA tenderness, guarding or rebound.     Comments: Soft, nontender  Musculoskeletal:        General: Normal range of motion.     Cervical back: Full passive range of motion without pain and normal range of motion.     Comments: No bony tenderness.  Moves all 4 extremities without difficulty  Skin:    General: Skin is warm and dry.     Capillary  Refill: Capillary refill takes less than 2 seconds.     Comments: No rashes or lesions  Neurological:     General: No focal deficit present.     Mental Status: She is alert.     Cranial Nerves: Cranial nerves are intact.     Sensory: Sensation is intact.     Gait: Gait is intact.    ED Results / Procedures / Treatments   Labs (all labs ordered are listed, but only abnormal results are displayed) Labs Reviewed  GROUP A STREP BY PCR  SARS CORONAVIRUS 2 (TAT 6-24  HRS)    EKG None  Radiology DG Chest Portable 1 View  Result Date: 05/07/2021 CLINICAL DATA:  Cough for 2 weeks. EXAM: PORTABLE CHEST 1 VIEW COMPARISON:  05/28/2013 FINDINGS: The heart size and mediastinal contours are within normal limits. Both lungs are clear. The visualized skeletal structures are unremarkable. IMPRESSION: No active disease. Electronically Signed   By: Norva PavlovElizabeth  Brown M.D.   On: 05/07/2021 11:35    Procedures Procedures   Medications Ordered in ED Medications - No data to display  ED Course  I have reviewed the triage vital signs and the nursing notes.  Pertinent labs & imaging results that were available during my care of the patient were reviewed by me and considered in my medical decision making (see chart for details).  50 year old here for evaluation of upper respiratory symptoms.  She is afebrile, nonseptic, non-ill-appearing.  Heart and lungs clear.  Abdomen soft, nontender.  Nonfocal neuro exam without deficits.  No neck stiffness or neck rigidity.  No meningismus.  She is no evidence of PTA or RPA.  Does have some mild posterior oropharyngeal erythema with 1+ tonsils bilaterally however no exudate.  Clear rhinorrhea bilaterally however no congestion. Question viral illness?  Chest x-ray negative for pneumonia which I personally reviewed and interpreted.  Strep test negative.  Low suspicion for acute bacterial infectious process as cause of symptoms.  No sinus pressure or congestion to suggest  bacterial sinusitis.  Does also seem to have some degree of seasonal allergies which she has history of.  DC home with symptomatic management.  She will follow-up with PCP for evaluation.  The patient has been appropriately medically screened and/or stabilized in the ED. I have low suspicion for any other emergent medical condition which would require further screening, evaluation or treatment in the ED or require inpatient management.  Patient is hemodynamically stable and in no acute distress.  Patient able to ambulate in department prior to ED.  Evaluation does not show acute pathology that would require ongoing or additional emergent interventions while in the emergency department or further inpatient treatment.  I have discussed the diagnosis with the patient and answered all questions.  Pain is been managed while in the emergency department and patient has no further complaints prior to discharge.  Patient is comfortable with plan discussed in room and is stable for discharge at this time.  I have discussed strict return precautions for returning to the emergency department.  Patient was encouraged to follow-up with PCP/specialist refer to at discharge.    MDM Rules/Calculators/A&P                          Emma Vaughn was evaluated in Emergency Department on 05/07/2021 for the symptoms described in the history of present illness. She was evaluated in the context of the global COVID-19 pandemic, which necessitated consideration that the patient might be at risk for infection with the SARS-CoV-2 virus that causes COVID-19. Institutional protocols and algorithms that pertain to the evaluation of patients at risk for COVID-19 are in a state of rapid change based on information released by regulatory bodies including the CDC and federal and state organizations. These policies and algorithms were followed during the patient's care in the ED. Final Clinical Impression(s) / ED Diagnoses Final diagnoses:   Viral upper respiratory tract infection    Rx / DC Orders ED Discharge Orders         Ordered    fluticasone (FLONASE)  50 MCG/ACT nasal spray  Daily        05/07/21 1302    cetirizine (ZYRTEC ALLERGY) 10 MG tablet  Daily        05/07/21 1302    benzonatate (TESSALON) 100 MG capsule  Every 8 hours        05/07/21 1302           Patsy Zaragoza A, PA-C 05/07/21 1315    Little, Ambrose [place], MD 05/07/21 1557

## 2021-05-07 NOTE — Discharge Instructions (Signed)
It was a pleasure taking care of you here in the emergency  Your chest x-ray does not show evidence of pneumonia  Strep test was negative  Your COVID test was pending at discharge  This is a likely viral upper respiratory infection.  We have given you symptomatic management.  Follow-up with primary care provider if you still have symptoms beyond 1 week and your COVID test is negative.

## 2021-05-08 LAB — SARS CORONAVIRUS 2 (TAT 6-24 HRS): SARS Coronavirus 2: NEGATIVE

## 2022-07-19 IMAGING — DX DG CHEST 1V PORT
1 series · 1 of 1 positions shown · non-contrast
Comparison: 05/28/2013

CLINICAL DATA: Cough for 2 weeks.

EXAM:
PORTABLE CHEST 1 VIEW

[chest ap]
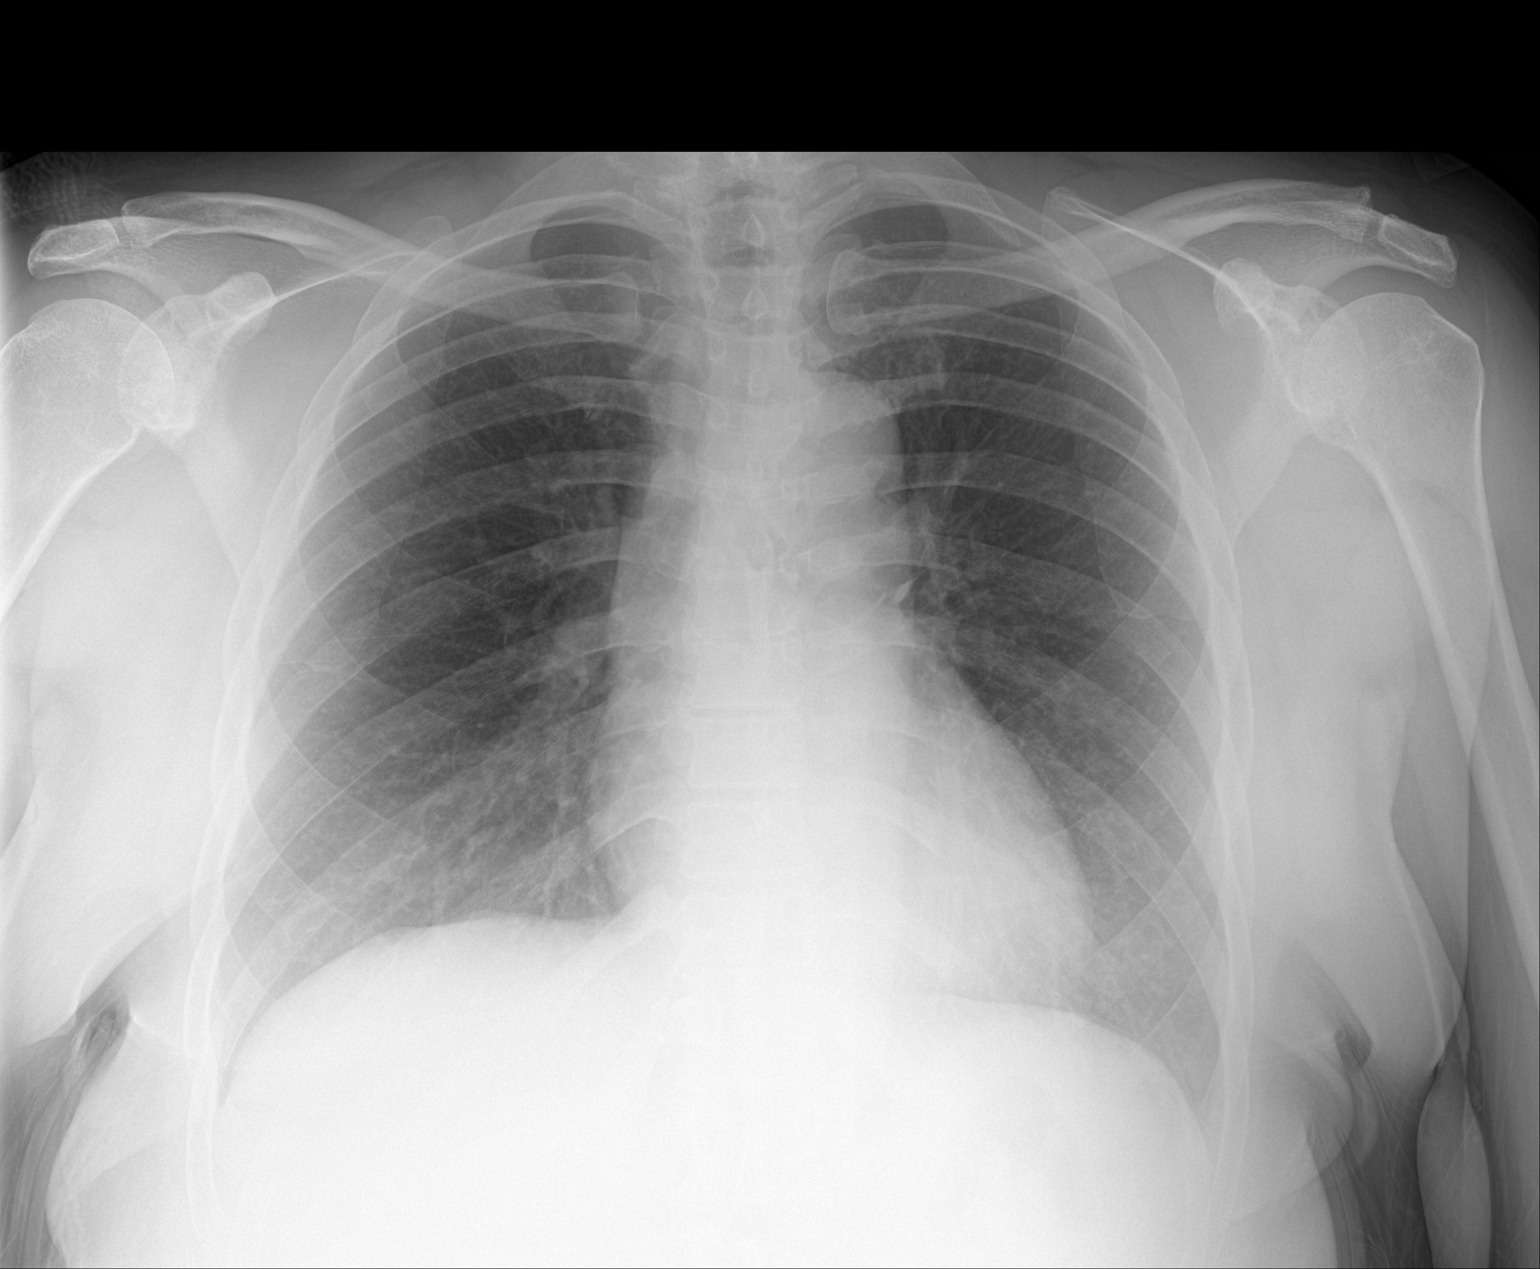

[1 of 1 positions shown; findings below may reference images not displayed]

FINDINGS: The heart size and mediastinal contours are within normal limits.
Both lungs are clear. The visualized skeletal structures are
unremarkable.
IMPRESSION: No active disease.

## 2022-09-15 DIAGNOSIS — Z3041 Encounter for surveillance of contraceptive pills: Secondary | ICD-10-CM | POA: Diagnosis not present

## 2022-09-15 DIAGNOSIS — R030 Elevated blood-pressure reading, without diagnosis of hypertension: Secondary | ICD-10-CM | POA: Diagnosis not present

## 2023-04-18 ENCOUNTER — Telehealth: Payer: Self-pay

## 2023-04-18 NOTE — Telephone Encounter (Signed)
LVM for patient to call back to schedule apt. AS, CMA
